# Patient Record
Sex: Male | Born: 1975 | Race: White | Hispanic: No
Health system: Southern US, Community
[De-identification: ages and names within clinical notes are randomized; demographics above are authoritative.]

---

## 2012-02-18 LAB — COMPREHENSIVE METABOLIC PANEL
Albumin: 3.7 g/dL (ref 3.4–5.0)
Anion Gap: 6 — ABNORMAL LOW (ref 7–16)
BUN: 8 mg/dL (ref 7–18)
Bilirubin,Total: 0.5 mg/dL (ref 0.2–1.0)
Calcium, Total: 8.9 mg/dL (ref 8.5–10.1)
Chloride: 99 mmol/L (ref 98–107)
EGFR (African American): >60
EGFR (Non-African Amer.): >60
Glucose: 144 mg/dL — ABNORMAL HIGH (ref 65–99)
Osmolality: 273 (ref 275–301)
Potassium: 3.8 mmol/L (ref 3.5–5.1)
SGOT(AST): 70 U/L — ABNORMAL HIGH (ref 15–37)

## 2012-02-18 LAB — CBC
HGB: 15.6 g/dL (ref 13.0–18.0)
MCH: 32 pg (ref 26.0–34.0)
MCHC: 34.2 g/dL (ref 32.0–36.0)
MCV: 94 fL (ref 80–100)
RBC: 4.9 10*6/uL (ref 4.40–5.90)

## 2012-02-18 LAB — URINALYSIS, COMPLETE
Bacteria: NONE SEEN
Bilirubin,UR: NEGATIVE
Blood: NEGATIVE
Ketone: NEGATIVE
Ph: 8 (ref 4.5–8.0)
Specific Gravity: 1.002 (ref 1.003–1.030)
Squamous Epithelial: <1

## 2012-02-18 LAB — PROTIME-INR
INR: 1
Prothrombin Time: 13.3 secs (ref 11.5–14.7)

## 2012-02-18 LAB — TROPONIN I: Troponin-I: <0.02 ng/mL

## 2012-02-19 ENCOUNTER — Inpatient Hospital Stay: Payer: Self-pay | Admitting: Internal Medicine

## 2012-02-19 LAB — HEMOGLOBIN
HGB: 14.5 g/dL (ref 13.0–18.0)
HGB: 14.8 g/dL (ref 13.0–18.0)

## 2012-02-19 LAB — TROPONIN I: Troponin-I: <0.02 ng/mL

## 2012-02-20 LAB — HEMOGLOBIN: HGB: 14.4 g/dL (ref 13.0–18.0)

## 2012-02-20 LAB — KOH PREP

## 2012-02-25 LAB — PATHOLOGY REPORT

## 2012-07-20 ENCOUNTER — Emergency Department: Payer: Self-pay | Admitting: Emergency Medicine

## 2012-07-20 LAB — PROTIME-INR: INR: 7.2

## 2012-07-20 LAB — BASIC METABOLIC PANEL
Anion Gap: 9 (ref 7–16)
BUN: 6 mg/dL — ABNORMAL LOW (ref 7–18)
Chloride: 100 mmol/L (ref 98–107)
Co2: 28 mmol/L (ref 21–32)
Creatinine: 0.8 mg/dL (ref 0.60–1.30)
EGFR (Non-African Amer.): >60
Glucose: 129 mg/dL — ABNORMAL HIGH (ref 65–99)
Potassium: 3.2 mmol/L — ABNORMAL LOW (ref 3.5–5.1)
Sodium: 137 mmol/L (ref 136–145)

## 2012-07-20 LAB — CBC
HCT: 43.3 % (ref 40.0–52.0)
Platelet: 242 10*3/uL (ref 150–440)
RBC: 4.52 10*6/uL (ref 4.40–5.90)
RDW: 13.2 % (ref 11.5–14.5)
WBC: 13.7 10*3/uL — ABNORMAL HIGH (ref 3.8–10.6)

## 2012-07-20 LAB — CK TOTAL AND CKMB (NOT AT ARMC)
CK, Total: 124 U/L (ref 35–232)
CK-MB: <0.5 ng/mL — ABNORMAL LOW (ref 0.5–3.6)

## 2012-07-20 LAB — TROPONIN I: Troponin-I: <0.02 ng/mL

## 2012-10-09 ENCOUNTER — Emergency Department: Payer: Self-pay | Admitting: Emergency Medicine

## 2012-10-09 LAB — CBC
HCT: 47.6 % (ref 40.0–52.0)
MCH: 32 pg (ref 26.0–34.0)
MCV: 94 fL (ref 80–100)
Platelet: 266 10*3/uL (ref 150–440)
RDW: 15.6 % — ABNORMAL HIGH (ref 11.5–14.5)

## 2012-10-09 LAB — BASIC METABOLIC PANEL
Anion Gap: 10 (ref 7–16)
Creatinine: 0.9 mg/dL (ref 0.60–1.30)
EGFR (Non-African Amer.): >60
Glucose: 141 mg/dL — ABNORMAL HIGH (ref 65–99)
Osmolality: 280 (ref 275–301)
Potassium: 3.5 mmol/L (ref 3.5–5.1)

## 2012-10-17 ENCOUNTER — Emergency Department: Payer: Self-pay | Admitting: Emergency Medicine

## 2012-10-17 LAB — HEPATIC FUNCTION PANEL A (ARMC)
Alkaline Phosphatase: 117 U/L (ref 50–136)
Bilirubin, Direct: 0.1 mg/dL (ref 0.00–0.20)
Bilirubin,Total: 0.5 mg/dL (ref 0.2–1.0)
SGOT(AST): 75 U/L — ABNORMAL HIGH (ref 15–37)
Total Protein: 7.8 g/dL (ref 6.4–8.2)

## 2012-10-17 LAB — PROTIME-INR
INR: 1.3
Prothrombin Time: 15.8 secs — ABNORMAL HIGH (ref 11.5–14.7)

## 2012-10-17 LAB — TROPONIN I: Troponin-I: <0.02 ng/mL

## 2012-10-17 LAB — BASIC METABOLIC PANEL
Calcium, Total: 9.1 mg/dL (ref 8.5–10.1)
Chloride: 102 mmol/L (ref 98–107)
EGFR (African American): >60
EGFR (Non-African Amer.): >60
Glucose: 126 mg/dL — ABNORMAL HIGH (ref 65–99)
Osmolality: 276 (ref 275–301)
Sodium: 138 mmol/L (ref 136–145)

## 2012-10-17 LAB — CBC
HCT: 48.3 % (ref 40.0–52.0)
MCH: 32.1 pg (ref 26.0–34.0)
MCHC: 34.4 g/dL (ref 32.0–36.0)
MCV: 93 fL (ref 80–100)
Platelet: 243 10*3/uL (ref 150–440)
RBC: 5.18 10*6/uL (ref 4.40–5.90)
RDW: 15.3 % — ABNORMAL HIGH (ref 11.5–14.5)
WBC: 9.2 10*3/uL (ref 3.8–10.6)

## 2012-10-17 LAB — CK TOTAL AND CKMB (NOT AT ARMC): CK, Total: 215 U/L (ref 35–232)

## 2013-03-23 ENCOUNTER — Emergency Department: Payer: Self-pay | Admitting: Emergency Medicine

## 2013-03-23 LAB — BASIC METABOLIC PANEL
BUN: 9 mg/dL (ref 7–18)
Chloride: 95 mmol/L — ABNORMAL LOW (ref 98–107)
EGFR (African American): >60
Osmolality: 265 (ref 275–301)
Potassium: 3 mmol/L — ABNORMAL LOW (ref 3.5–5.1)
Sodium: 132 mmol/L — ABNORMAL LOW (ref 136–145)

## 2013-03-23 LAB — CBC
HCT: 50.2 % (ref 40.0–52.0)
HGB: 17.6 g/dL (ref 13.0–18.0)
MCH: 34.1 pg — ABNORMAL HIGH (ref 26.0–34.0)
MCHC: 35.1 g/dL (ref 32.0–36.0)
RBC: 5.18 10*6/uL (ref 4.40–5.90)
WBC: 8.4 10*3/uL (ref 3.8–10.6)

## 2013-03-23 LAB — TROPONIN I: Troponin-I: <0.02 ng/mL

## 2013-09-07 IMAGING — CR DG CHEST 2V
1 series · 2 of 2 positions shown · non-contrast
Comparison: none

REASON FOR EXAM: Chest Pain
COMMENTS:

PROCEDURE:     DXR - DXR CHEST PA (OR AP) AND LATERAL  - October 17, 2012  [DATE]
RESULT:     The lungs are clear. The cardiac silhouette and visualized bony
skeleton are unremarkable.

[Series 1: w chest pa · 0.14mm/px · 2 of 2 slices shown]
[im 1/2]
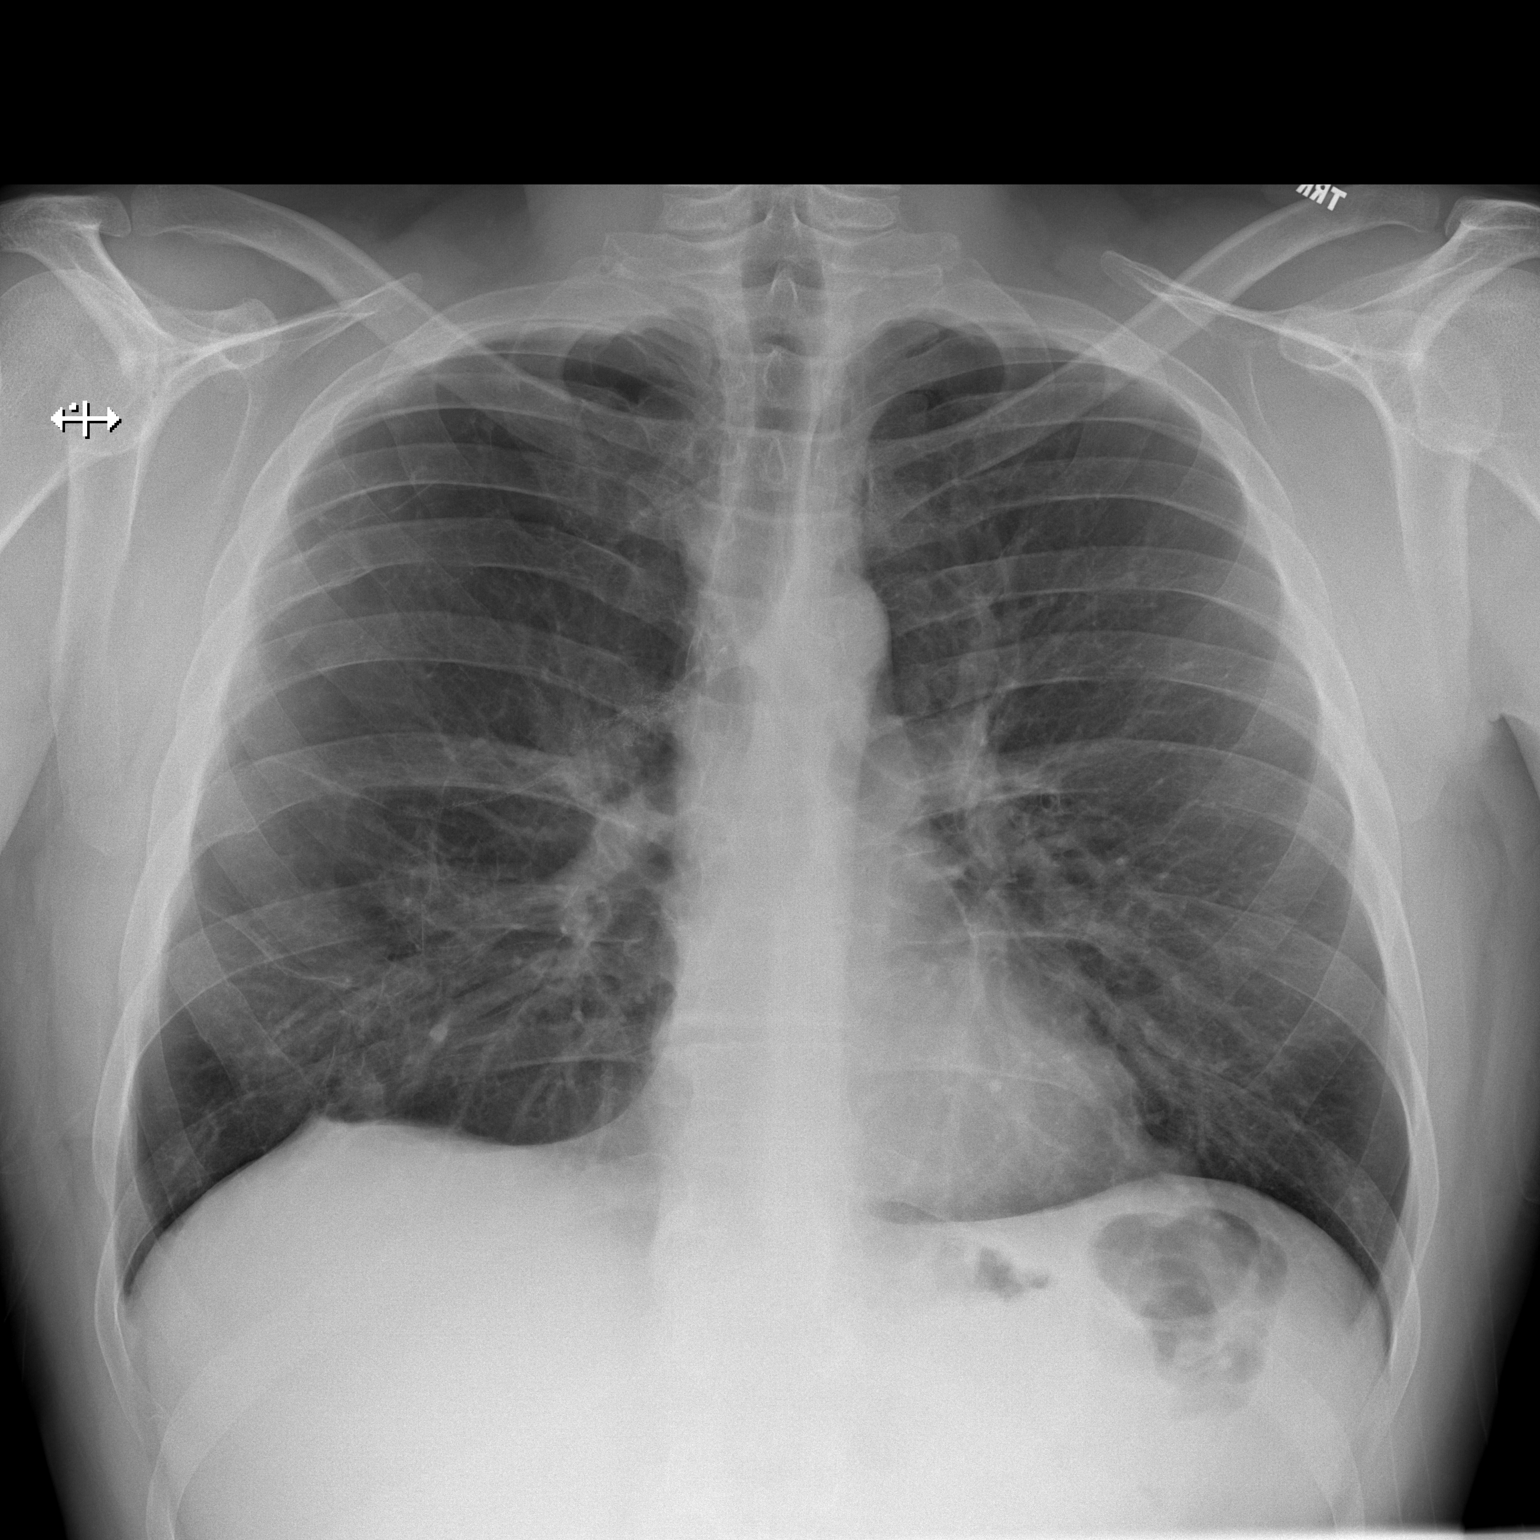
[im 2/2]
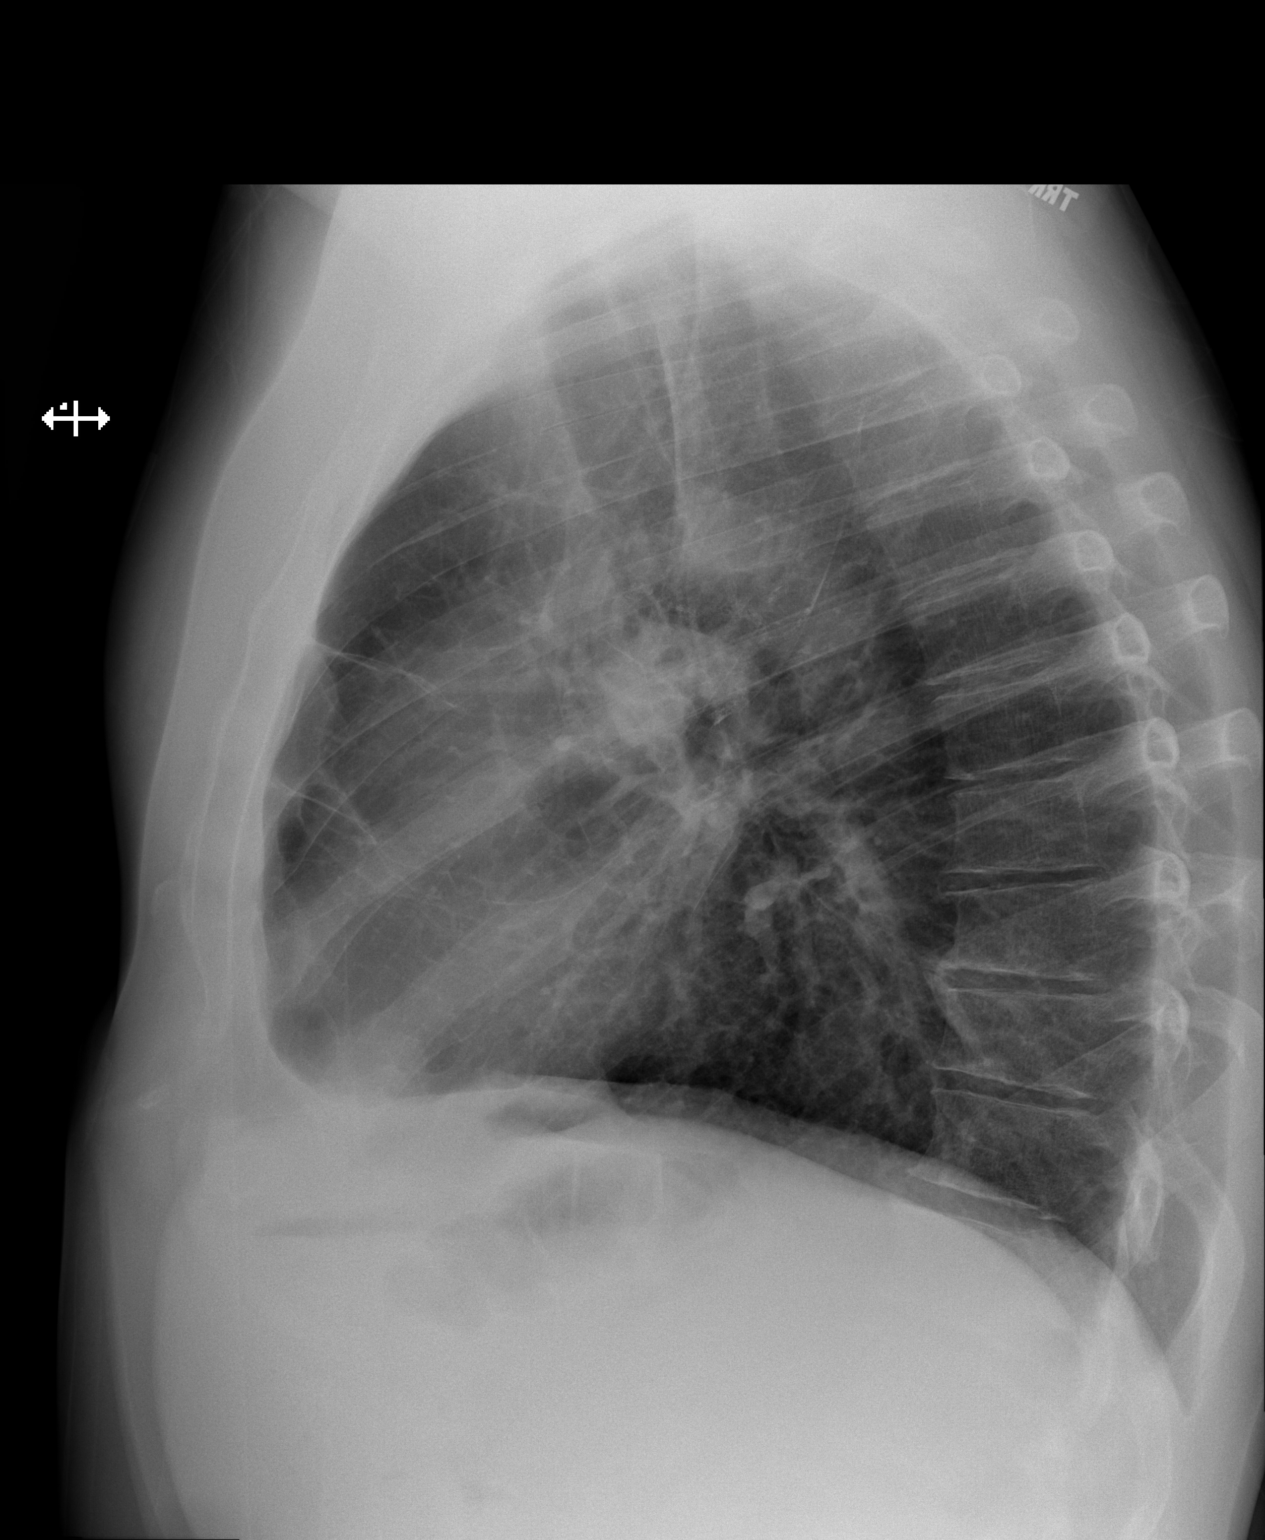

[2 of 2 positions shown; findings below may reference images not displayed]

IMPRESSION: 1. Chest radiograph without evidence of acute cardiopulmonary disease.
2. Comparison made to prior study dated 10/09/2012.

## 2014-09-28 NOTE — H&P (Signed)
PATIENT NAME:  Jacob Washington, Riggs MR#:  [id number] DATE OF BIRTH:  [birth year]  DATE OF ADMISSION:  02/19/2012  PRIMARY CARE PHYSICIAN: The patient has no primary care physician.   CHIEF COMPLAINT: Epigastric and lower midchest pain associated with hematemesis.   HISTORY OF PRESENT ILLNESS: Jacob Washington is a 39 year old Caucasian male who just got out of prison a month ago. He has no local doctor to follow up with. He has a prior history of alcohol abuse, history of stomach ulcers, history of deep vein thrombosis. The patient also has a history of severe chronic obstructive pulmonary disease and tobacco abuse. The patient indicates that he has had some chest pain since his lung surgery a year ago. 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 Since that time he has had chest pain; however, since Friday- that is, a week ago- he had escalation in his chest pain. He is referring to the mid lower chest anteriorly and also the epigastric area, associated with occasional vomiting of his undigested food which is mixed with some streaks of blood. He also described black, tarry stool. The patient finally decided to come to the hospital because the pain is bothering him. He reports no fever. No chills. No syncope or near syncope. No shortness of breath. No leg pain.   REVIEW OF SYSTEMS:  CONSTITUTIONAL: Denies any fever. No chills. No fatigue. No palpitations. EYES: No blurring of vision. No double vision. ENT: No hearing impairment. No sore throat. No dysphagia. CARDIOVASCULAR: No chest pain other than his lower anterior mid chest pain in the epigastric area. No shortness of breath. No edema. No syncope. RESPIRATORY: No shortness of breath. No cough. No hemoptysis. GASTROINTESTINAL: He has epigastric pain and vomiting with occasional streaks of blood. He has also black, tarry stool. GENITOURINARY: No dysuria. No frequency of urination. MUSCULOSKELETAL: No joint pain or swelling. No muscular pain or swelling.  INTEGUMENT: No skin rash. No ulcers. NEURO: No focal weakness. No seizure activity. No headache. PSYCH: No anxiety. No depression. ENDOCRINE: No polyuria or polydipsia. No heat or cold intolerance.   PAST MEDICAL HISTORY:  1. History of chronic alcoholism. He cut down to drinking liquor now just over the weekend.  2. History of gastric ulcers.  3. Systemic hypertension.  4. Gastroesophageal reflux disease. 5. Chronic obstructive pulmonary disease, primarily emphysema.  6. Tobacco abuse.  7. 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   SOCIAL HABITS: Chronic smoker, used to smoke 2 packs a day since age of 39. He cut down to 1/2 pack a day.  History of chronic alcoholism, drinking liquor used to be daily but now over weekends only. He is drinking currently a pint of liquor over the weekend.  Last drink was a week ago. He is stating that he is trying to cut down and stop.   FAMILY HISTORY: His father died at age of 40 after having a massive heart attack. His mother is alive. She suffers from obesity but no other medical problems.     ADMISSION MEDICATIONS:  None.  ALLERGIES: No known drug allergies.   PHYSICAL EXAMINATION:  VITAL SIGNS: Blood pressure 134/71, respiratory rate 18, pulse 63, temperature 97.9, oxygen saturation 100%.   GENERAL APPEARANCE: Young male laying in bed in no acute distress.   HEAD: No pallor. No icterus. No cyanosis.  ENT: Hearing was normal. Nasal mucosa, lips, tongue were normal.   EYES: Normal eyelids and conjunctivae. Pupils about 6 mm, equal and reactive to light.   NECK: Supple. Trachea at midline. No thyromegaly. No cervical lymphadenopathy. No masses.   HEART: Distant heart sounds. No murmur was appreciated. No carotid bruits.   LUNGS: Normal breathing pattern without use of  accessory muscles. No rales. No wheezing. He has decreased breathing sounds bilaterally.   ABDOMEN: Soft. There is moderate tenderness at the epigastric area and upon deep palpation. No rebound. No rigidity. No hepatosplenomegaly. No hernia.   SKIN: No ulcers. No subcutaneous nodules.   MUSCULOSKELETAL: No joint swelling. No clubbing.   NEUROLOGIC: Cranial nerves II through XII are intact. No focal motor deficit.   PSYCHIATRIC:  The patient is alert and oriented times three. Mood and affect were normal.  LABORATORY, DIAGNOSTIC, AND RADIOLOGICAL DATA: Chest x-ray showed no acute cardiopulmonary abnormality. There is focal lobular opacity in the anterior aspect of the lung. This could be related to scarring from partial lung resection. However, cannot exclude a small mass. CT scan of the abdomen and pelvis showed a right perihilar infiltrate versus scarring. No evidence of pulmonary embolism. There is mild fullness in the anterior mediastinum, most likely residual thymus. There is severe bullous chronic obstructive pulmonary disease and scarring. Mild dynamic ileus. His EKG showed normal sinus rhythm or sinus bradycardia at rate of 58 per minute. Incomplete right bundle branch block, otherwise unremarkable EKG.   Serum glucose 144, BUN 8, creatinine 1.08, sodium 136, potassium 3.8. Liver function tests were normal except for the liver transaminases. The AST is elevated at 70, ALT was 61. Normal bilirubin and alkaline phosphatase. Troponin less than 0.02. CBC showed white count of 7000, hemoglobin 15, hematocrit 45, platelet count 252. Prothrombin time 13. INR 1. Urinalysis was unremarkable.   ASSESSMENT:  1. Vomiting associated with streaks of hematemesis in the form of red blood.  2. Melena secondary to upper gastrointestinal bleed.  3. Anterior lower chest pain. This had escalated with the epigastric pain at the same time his gastrointestinal bleeding started. His symptoms are suggestive of GI  origin of his complaint.  4. Chronic alcoholism.  5. Chronic obstructive pulmonary disease.  6. History of gastric ulcers. 7. History of systemic hypertension.  8. History of deep vein thromboses and pulmonary embolism.   9. Tobacco abuse.   PLAN: We will admit the patient to the medical floor on telemetry monitoring. He is clinically stable. He is not tachycardic. His blood pressure is normal and his hemoglobin is normal.  GI consultation was obtained and Dr. Enedina Finner from the Emergency Department contacted Dr. Marva Panda. I will keep the patient n.p.o. for anticipation of upper endoscopy. IV Protonix drip was started. Pain control. Follow hemoglobin every six hours times three. Gentle IV hydration. The patient needs to quit both tobacco and alcohol.   TIME SPENT EVALUATING THIS PATIENT: More than 55 minutes.     ____________________________ Carney Corners. Rudene Re, MD amd:bjt D: 02/19/2012 01:45:59 ET T: 02/19/2012 08:12:28 ET JOB#: [id number]  cc: Carney Corners. Rudene Re, MD, <Dictator> Karolee Ohs Dala Dock MD ELECTRONICALLY SIGNED 02/19/2012 22:19

## 2014-09-28 NOTE — Consult Note (Signed)
PATIENT NAME:  Jacob Washington, Jacob MR#:  [id number] DATE OF BIRTH:  Jul 11, 1975  DATE OF CONSULTATION:  02/20/2012  REFERRING PHYSICIAN:  Dr. Alford Highlandichard Wieting  CONSULTING PHYSICIAN:  Lurline DelShaukat Mikey Maffett, MD  REASON FOR CONSULTATION: Abdominal pain, hematemesis.   HISTORY OF PRESENT ILLNESS: 39 year old Caucasian male who just got out of prison about a month ago. He has history of alcohol abuse. According to him he has history of stomach ulcers as well. Also history of the deep vein thrombosis and prior treatment with Coumadin, although he is not on any blood thinners at this time. Patient also has severe chronic obstructive pulmonary disease and history of chronic tobacco abuse. According to the patient for about a week or so he has been having chest pain mainly in the mid lower chest area as well as some epigastric discomfort. He gives report of vomiting for the last couple of days with streaks of blood. Also complaining of black tarry stools in the last couple of days. Came to the hospital, hemoglobin and hematocrit is normal. Patient was evaluated yesterday sitting up eating. Did not appear to be in any acute distress. Denies any further melena or hematemesis.   PAST MEDICAL HISTORY:  1. 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  2. History of chronic alcoholism. 3. Reported history of gastric ulcers. 4. Hypertension. 5. Gastroesophageal reflux disease. 6. Left leg deep venous thrombosis.   PAST SURGICAL HISTORY: Right lung partial resection for bullous emphysema in October 2012.   SOCIAL HISTORY: Chronic smoker, smokes about 2 packs a day. History of chronic alcoholism as well.   ALLERGIES: None.   MEDICATIONS: None. He denies using over-the-counter nonsteroidals.   REVIEW OF SYSTEMS: Negative except for what is mentioned in the History of Present Illness. He denies significant chest pain or shortness of breath.   PHYSICAL EXAMINATION:  GENERAL: Fairly  well-built male, did not appear to be in any significant distress.   HEENT: Unremarkable. He is not jaundiced or anemic.   VITAL SIGNS: Stable. Temperature 97.6, pulse rate in 50s and 60s, respirations 18 to 20, blood pressure 113/66.   LUNGS: Grossly clear to auscultation, although air entry is diminished bilaterally.   CARDIOVASCULAR: Regular rate and rhythm. No murmurs.   ABDOMEN: Quite soft and benign, nontender. No rebound or guarding was noted.   EXTREMITIES: No edema.   NEUROLOGIC: Examination appears to be unremarkable.   LABORATORY, DIAGNOSTIC, AND RADIOLOGICAL DATA: His hemoglobin was 15.6 on admission and remained stables after 42 hours at 14.8. PT-INR within normal limits. Troponin less than 0.02. Electrolytes are unremarkable. Serum lipase is normal at 73. CT scan of chest, abdomen and pelvis showed right perihilar infiltrate versus scarring. Mild fullness in anterior mediastinum. Severe bullous chronic obstructive pulmonary disease and suspects mild adynamic ileus.   ASSESSMENT AND PLAN: Patient with reported nausea, vomiting, and melena. There are no signs of active GI blood loss. He has not had any melena or hematemesis in the hospital. His hemoglobin and hematocrit remains normal. Patient has history of prior gastric ulcers. 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 Agree with Dr. Renae GlossWieting that an upper GI endoscopy would be reasonable prior to discharge. Patient seems to be doing well and tolerating his liquid diet well as well. Procedure of upper GI endoscopy was discussed with the patient along with its complications. He is in full agreement. Will proceed with an upper  GI endoscopy today. Continue proton pump inhibitor.    Further recommendations to follow.   ____________________________ Lurline Del, MD si:cms D: 02/20/2012 10:53:21  ET T: 02/20/2012 11:16:30 ET JOB#: [id number]  cc: Lurline Del, MD, <Dictator> Lurline Del MD ELECTRONICALLY SIGNED 03/03/2012 12:35

## 2014-09-28 NOTE — Consult Note (Signed)
Brief Consult Note: Diagnosis: Abdominal pain.   Patient was seen by consultant.   Discussed with Attending MD.   Comments: Epigastric pain and indigestion. Severe COPD. ? Hematemesis. H and H normal. No signs of active GI bleed.  Recommendations: Continue PPI. EGD in am.  Electronic Signatures: Lurline DelIftikhar, Tranae Laramie (MD)  (Signed 10-Sep-13 17:33)  Authored: Brief Consult Note   Last Updated: 10-Sep-13 17:33 by Lurline DelIftikhar, Sharrieff Spratlin (MD)

## 2014-09-28 NOTE — Consult Note (Signed)
Chief Complaint:   Subjective/Chief Complaint EGD doen. 1. Erosive gastritis. 2. Mild candida esophagitis. 3. Small hiatal hernia. 4. healing MW tear.  Recommendations: PO PPI as OP. Diflucan 100 mg a day for 7 days. Discussed with Dr. Hilton SinclairWeiting. Will sign off. Thanks.   Electronic Signatures: Lurline DelIftikhar, Unice Vantassel (MD)  (Signed 11-Sep-13 11:15)  Authored: Chief Complaint   Last Updated: 11-Sep-13 11:15 by Lurline DelIftikhar, Roshan Salamon (MD)

## 2014-09-28 NOTE — Discharge Summary (Signed)
PATIENT NAME:  Jacob Washington, Jacob Washington MR#:  [id number] DATE OF BIRTH:  [birth year]  DATE OF ADMISSION:  02/19/2012 DATE OF DISCHARGE:  02/20/2012  PR[facility]ARE [facility]YSICIAN[facility]   FINAL DIAGNOSES:  1. Hematemesis with abdominal pain found to have gastritis, Mallory-Weiss tear, and Candida esophagitis.  2. Chronic chest pain.  3. Chronic obstructive pulmonary disease.  4. Tobacco abuse.   MEDICATIONS ON DISCHARGE:  1. Oxycodone 5 mg 1 tablet every eight hours as needed for pain. 2. Fluconazole 100 mg daily for seven days.  3. Colace 100 mg twice a day as needed for constipation.  4. Omeprazole 20 mg twice a day.  5. Nicotine inhalation device 1 inhalation every hour as needed for nicotine craving.   DISCHARGE INSTRUCTIONS: Do not smoke cigarettes. Do not drink alcohol. No BC powder. No Advil. No Motrin. No Aleve.    DIET: Soft diet for a few days then regular after.   ACTIVITY: Activity as tolerated.   REFERRAL: Dr. Niel HummerIftikhar as needed.   REASON FOR ADMISSION: The patient was admitted 02/19/2012, discharged 02/20/2012. He came in with epigastric and lower mid chest pain and hematemesis. The patient was admitted with hematemesis and chest pain.  He was started on IV fluid hydration.  LABORATORY, DIAGNOSTIC, AND RADIOLOGICAL DATA: EKG showed sinus bradycardia, incomplete right bundle branch block. INR normal. Troponin negative. Lipase 73. Glucose 144, BUN 8, creatinine 1.08, sodium 136, potassium 3.8, chloride 99, CO2 31, calcium 8.9. Liver function tests: AST elevated at 70. White blood cell count 7.5, hemoglobin and hematocrit 15.6 and 45.8, platelet count 252. Urinalysis negative.  Chest x-ray: No acute cardial pulmonary disease, focal lobular opacity in the anterior aspect of the lungs on lateral view. Could be from scarring. CT scan of the chest, abdomen, and pelvis with contrast showed right perihilar infiltrate versus scarring. No pulmonary embolism. Coronary  calcification. Fullness in the anterior mediastinum, most likely right residual thymus. Severe bullous chronic obstructive pulmonary disease and scarring. Mild dynamic ileus. Hemoglobin remained stable, 14.4 upon discharge. Yeast seen on the KOH prep. Cardiac enzymes negative.   HOSPITAL COURSE PER PROBLEM LIST:  1. The patient's hematemesis and abdominal pain, EGD showed gastritis, Mallory-Weiss, and Candida esophagitis. The patient was on Protonix drip initially and switched over to omeprazole upon discharge. He ate and tolerated his lunch and was discharged home in stable condition. The patient was advised not to drink alcohol and to stop smoking. No BC powder. No Advil. No Motrin or Aleve. Omeprazole continued.  2. For the patient's chronic chest pain,  I believe that is from a surgical procedure. Oxycodone prescribed. Do recommend pain management as outpatient.  3. Chronic obstructive pulmonary disease. Respiratory status stable during the hospitalization. Can consider Spiriva as outpatient if he does follow up.  4. Tobacco abuse. Smoking cessation counseling done for three minutes by me. Nicotine inhaler prescribe upon discharge.  5. The patient did have an enlargement in the mediastinal area, probably residual thymus. Can follow up with a CT scan as an outpatient for further evaluation.    TIME SPENT ON DISCHARGE: 35 minutes.    ____________________________ Herschell Dimesichard J. Renae GlossWieting, MD rjw:bjt D: 02/21/2012 15:21:10 ET T: 02/22/2012 10:53:50 ET JOB#: [id number]  cc: Herschell Dimesichard J. Renae GlossWieting, MD, <Dictator> Salley ScarletICHARD J Afsana Liera MD ELECTRONICALLY SIGNED 02/22/2012 16:54
